# Patient Record
Sex: Female | Born: 1975 | Race: Black or African American | Hispanic: No | Marital: Married | Smoking: Never smoker
Health system: Southern US, Community
[De-identification: ages and names within clinical notes are randomized; demographics above are authoritative.]

---

## 2019-03-09 ENCOUNTER — Emergency Department (HOSPITAL_BASED_OUTPATIENT_CLINIC_OR_DEPARTMENT_OTHER): Payer: Self-pay

## 2019-03-09 ENCOUNTER — Emergency Department (HOSPITAL_BASED_OUTPATIENT_CLINIC_OR_DEPARTMENT_OTHER)
Admission: EM | Admit: 2019-03-09 | Discharge: 2019-03-10 | Disposition: A | Discharge status: Home / Self Care | Payer: Self-pay | Attending: Emergency Medicine | Admitting: Emergency Medicine

## 2019-03-09 ENCOUNTER — Other Ambulatory Visit: Payer: Self-pay

## 2019-03-09 ENCOUNTER — Encounter (HOSPITAL_BASED_OUTPATIENT_CLINIC_OR_DEPARTMENT_OTHER): Payer: Self-pay | Admitting: Emergency Medicine

## 2019-03-09 DIAGNOSIS — R1011 Right upper quadrant pain: Secondary | ICD-10-CM | POA: Insufficient documentation

## 2019-03-09 DIAGNOSIS — D72829 Elevated white blood cell count, unspecified: Secondary | ICD-10-CM | POA: Insufficient documentation

## 2019-03-09 DIAGNOSIS — R109 Unspecified abdominal pain: Secondary | ICD-10-CM

## 2019-03-09 DIAGNOSIS — D649 Anemia, unspecified: Secondary | ICD-10-CM | POA: Insufficient documentation

## 2019-03-09 LAB — COMPREHENSIVE METABOLIC PANEL
ALT: 17 U/L (ref 0–44)
AST: 19 U/L (ref 15–41)
Albumin: 4.1 g/dL (ref 3.5–5.0)
Alkaline Phosphatase: 48 U/L (ref 38–126)
Anion gap: 9 (ref 5–15)
BUN: 9 mg/dL (ref 6–20)
CO2: 24 mmol/L (ref 22–32)
Calcium: 9.6 mg/dL (ref 8.9–10.3)
Chloride: 104 mmol/L (ref 98–111)
Creatinine, Ser: 0.62 mg/dL (ref 0.44–1.00)
GFR calc Af Amer: >60 mL/min (ref >60)
GFR calc non Af Amer: >60 mL/min (ref >60)
Glucose, Bld: 125 mg/dL — ABNORMAL HIGH (ref 70–99)
Potassium: 3.8 mmol/L (ref 3.5–5.1)
Sodium: 137 mmol/L (ref 135–145)
Total Bilirubin: 0.3 mg/dL (ref 0.3–1.2)
Total Protein: 9.1 g/dL — ABNORMAL HIGH (ref 6.5–8.1)

## 2019-03-09 LAB — CBC
HCT: 33.7 % — ABNORMAL LOW (ref 36.0–46.0)
Hemoglobin: 10.2 g/dL — ABNORMAL LOW (ref 12.0–15.0)
MCH: 22.3 pg — ABNORMAL LOW (ref 26.0–34.0)
MCHC: 30.3 g/dL (ref 30.0–36.0)
MCV: 73.6 fL — ABNORMAL LOW (ref 80.0–100.0)
Platelets: 349 10*3/uL (ref 150–400)
RBC: 4.58 MIL/uL (ref 3.87–5.11)
RDW: 16.8 % — ABNORMAL HIGH (ref 11.5–15.5)
WBC: 16.2 10*3/uL — ABNORMAL HIGH (ref 4.0–10.5)
nRBC: 0 % (ref 0.0–0.2)

## 2019-03-09 LAB — LIPASE, BLOOD: Lipase: 28 U/L (ref 11–51)

## 2019-03-09 MED ORDER — MORPHINE SULFATE (PF) 4 MG/ML IV SOLN
4.0000 mg | Freq: Once | INTRAVENOUS | Status: AC
  Administered 2019-03-09: 4 mg via INTRAVENOUS
  Filled 2019-03-09: qty 1

## 2019-03-09 MED ORDER — ONDANSETRON HCL 4 MG/2ML IJ SOLN
4.0000 mg | Freq: Once | INTRAMUSCULAR | Status: AC
  Administered 2019-03-09: 4 mg via INTRAVENOUS
  Filled 2019-03-09: qty 2

## 2019-03-09 MED ORDER — SODIUM CHLORIDE 0.9 % IV BOLUS
1000.0000 mL | Freq: Once | INTRAVENOUS | Status: AC
  Administered 2019-03-10: 1000 mL via INTRAVENOUS

## 2019-03-09 NOTE — ED Notes (Signed)
Patient in [place].

## 2019-03-09 NOTE — ED Triage Notes (Signed)
Pt is c/o right side abd pain that started this morning around 8am  Pt states the pain radiates into her back  Pt states she has used OTC medication without relief  Pt states she had vomiting this morning but it has subsided for now  No diarrhea

## 2019-03-09 NOTE — ED Provider Notes (Signed)
[facility] EMERGENCY DEPARTMENT Provider Note   CSN: [id number] Arrival date & time: 03/09/19  2048  History   Chief Complaint Chief Complaint  Patient presents with  . Abdominal Pain    HPI Paula Hurst is a 43 y.o. female without significant past medical hx who presents to the ED w/ complaints of abdominal pain that began @ 0800 this AM. Patient states she woke up feeling fairly okay, had a BM and shortly after developed fairly quick onset pain to the RUQ of the abdomen radiating around to her back. States pain is an aching/dull sensation, 8/10 in severity, and has been constant without alleviating/aggravating factors. Tried ginger ale, miralax, and tylenol without relief. Today is first day of her menses, typically she has several bowel movements w/ gas on first day of menses, however has not had re-occurrent bowel movement or gas since this AM. Has had nausea w/ 3 episodes of emesis earlier today but this has improved. Denies fever, chills, hematemesis, diarrhea, melena, dysuria, dyspnea, or chest pain.       HPI  History reviewed. No pertinent past medical history.  There are no active problems to display for this patient.   History reviewed. No pertinent surgical history.   OB History   No obstetric history on file.      Home Medications    Prior to Admission medications   Not on File    Family History Family History  Problem Relation Age of Onset  . Diabetes Other   . Hypertension Other     Social History Social History   Tobacco Use  . Smoking status: Never Smoker  . Smokeless tobacco: Never Used  Substance Use Topics  . Alcohol use: Never    Frequency: Never  . Drug use: Never     Allergies   Shellfish allergy   Review of Systems Review of Systems  Constitutional: Negative for chills and fever.  Respiratory: Negative for cough and shortness of breath.   Cardiovascular: Negative for chest pain.  Gastrointestinal: Positive for  abdominal pain, nausea and vomiting. Negative for blood in stool and diarrhea.  Genitourinary: Positive for vaginal bleeding (menses). Negative for dysuria and vaginal discharge.  All other systems reviewed and are negative.    Physical Exam Updated Vital Signs BP (!) 150/90 (BP Location: Left Arm)   Pulse 81   Temp 98.4 F (36.9 C) (Oral)   Resp 18   Ht 5\' 6"  (1.676 m)   Wt 108.4 kg   LMP 03/09/2019 (Exact Date)   SpO2 98%   BMI 38.58 kg/m   Physical Exam Vitals signs and nursing note reviewed.  Constitutional:      General: She is not in acute distress.    Appearance: She is well-developed. She is not toxic-appearing.  HENT:     Head: Normocephalic and atraumatic.  Eyes:     General:        Right eye: No discharge.        Left eye: No discharge.     Conjunctiva/sclera: Conjunctivae normal.  Neck:     Musculoskeletal: Neck supple.  Cardiovascular:     Rate and Rhythm: Normal rate and regular rhythm.  Pulmonary:     Effort: Pulmonary effort is normal. No respiratory distress.     Breath sounds: Normal breath sounds. No wheezing, rhonchi or rales.  Abdominal:     General: There is no distension.     Palpations: Abdomen is soft.     Tenderness: There is  abdominal tenderness in the right upper quadrant. There is no guarding or rebound. Negative signs include McBurney's sign.  Skin:    General: Skin is warm and dry.     Findings: No rash.  Neurological:     Mental Status: She is alert.     Comments: Clear speech.   Psychiatric:        Behavior: Behavior normal.    ED Treatments / Results  Labs (all labs ordered are listed, but only abnormal results are displayed) Labs Reviewed  COMPREHENSIVE METABOLIC PANEL - Abnormal; Notable for the following components:      Result Value   Glucose, Bld 125 (*)    Total Protein 9.1 (*)    All other components within normal limits  CBC - Abnormal; Notable for the following components:   WBC 16.2 (*)    Hemoglobin 10.2 (*)     HCT 33.7 (*)    MCV 73.6 (*)    MCH 22.3 (*)    RDW 16.8 (*)    All other components within normal limits  LIPASE, BLOOD  URINALYSIS, ROUTINE W REFLEX MICROSCOPIC  PREGNANCY, URINE    EKG None  Radiology No results found.  Procedures Procedures (including critical care time)  Medications Ordered in ED Medications  morphine 4 MG/ML injection 4 mg (4 mg Intravenous Given 03/09/19 2243)  ondansetron (ZOFRAN) injection 4 mg (4 mg Intravenous Given 03/09/19 2243)     Initial Impression / Assessment and Plan / ED Course  I have reviewed the triage vital signs and the nursing notes.  Pertinent labs & imaging results that were available during my care of the patient were reviewed by me and considered in my medical decision making (see chart for details).   Patient presents to the ED w/ abdominal pain that began this AM following normal BM, no BM/passing gas since, onset of menses this AM as well. Has had nausea/vomiting that is improved. Nontoxic appearing, no apparent distress, vitals WNL with the exception of mildly elevated BP- doubt HTN emergency. Tenderness to palpation in the RUQ without peritoneal signs. Will evaluate initially w/ labs & RUQ US, analgesics & anti-emetics ordered, plan for re-assessment.   Work-up reviewed: CBC: Leukocytosis @ 16.2. Mild anemia w/ hgb/hct 10.2/33.7- no old on record for comparison- PCP recheck.  CMP: Mild hyperglycemia @ 125, otherwise unremarkable. LFTs & renal function WNL.  UA/preg test: in process.  RUQ US negative- no cholecystitis/cholelithiasis.  On re-evaluation patient states pain initially improved w/ morphine, has now returned, currently 6/10 in severity, remains w/ RUQ tenderness. Discussed w/ supervising physician Dr. Lockie Molauratolo- in agreement w/ plan for CT abdomen/pelvis for further assessment. Additional analgesics ordered at this time.   00:10: Patient care transitioned to Dr. Nicanor AlconPalumbo at change of shift pending CT imaging, urine,  & disposition.   Final Clinical Impressions(s) / ED Diagnoses   Final diagnoses:  Abdominal pain    ED Discharge Orders    None       Cherly Andersonetrucelli,  R, PA-C 03/10/19 0011    Virgina NorfolkCuratolo, Adam, DO 03/11/19 1549

## 2019-03-10 ENCOUNTER — Emergency Department (HOSPITAL_BASED_OUTPATIENT_CLINIC_OR_DEPARTMENT_OTHER): Payer: Self-pay

## 2019-03-10 LAB — URINALYSIS, ROUTINE W REFLEX MICROSCOPIC
Bilirubin Urine: NEGATIVE
Glucose, UA: NEGATIVE mg/dL
Ketones, ur: NEGATIVE mg/dL
Leukocytes,Ua: NEGATIVE
Nitrite: NEGATIVE
Protein, ur: 30 mg/dL — AB
Specific Gravity, Urine: 1.02 (ref 1.005–1.030)
pH: 6 (ref 5.0–8.0)

## 2019-03-10 LAB — URINALYSIS, MICROSCOPIC (REFLEX): WBC, UA: NONE SEEN WBC/hpf (ref 0–5)

## 2019-03-10 LAB — PREGNANCY, URINE: Preg Test, Ur: NEGATIVE

## 2019-03-10 MED ORDER — DICYCLOMINE HCL 20 MG PO TABS: 20.0000 mg | ORAL_TABLET | Freq: Two times a day (BID) | ORAL | 0 refills | Status: AC

## 2019-03-10 MED ORDER — KETOROLAC TROMETHAMINE 30 MG/ML IJ SOLN
15.0000 mg | Freq: Once | INTRAMUSCULAR | Status: AC
  Administered 2019-03-10: 15 mg via INTRAVENOUS
  Filled 2019-03-10: qty 1

## 2019-03-10 MED ORDER — FAMOTIDINE IN NACL 20-0.9 MG/50ML-% IV SOLN
20.0000 mg | Freq: Once | INTRAVENOUS | Status: AC
  Administered 2019-03-10: 20 mg via INTRAVENOUS
  Filled 2019-03-10: qty 50

## 2019-03-10 MED ORDER — IOHEXOL 300 MG/ML  SOLN
100.0000 mL | Freq: Once | INTRAMUSCULAR | Status: AC
  Administered 2019-03-10: 100 mL via INTRAVENOUS

## 2019-03-10 MED ORDER — MORPHINE SULFATE (PF) 4 MG/ML IV SOLN
4.0000 mg | Freq: Once | INTRAVENOUS | Status: AC
  Administered 2019-03-10: 4 mg via INTRAVENOUS
  Filled 2019-03-10: qty 1

## 2019-03-10 MED ORDER — ONDANSETRON HCL 4 MG/2ML IJ SOLN
4.0000 mg | Freq: Once | INTRAMUSCULAR | Status: AC
  Administered 2019-03-10: 4 mg via INTRAVENOUS
  Filled 2019-03-10: qty 2

## 2019-03-10 NOTE — ED Notes (Signed)
Patient able to hold down liquids.

## 2021-03-30 IMAGING — US ULTRASOUND ABDOMEN LIMITED
1 series · 14 of 25 positions shown · non-contrast
Comparison: None.

CLINICAL DATA: Right upper quadrant pain with nausea and vomiting

EXAM:
ULTRASOUND ABDOMEN LIMITED RIGHT UPPER QUADRANT

[Series 1: ultrasound abdomen limited · 14 of 36 slices shown]
[im 1/36]
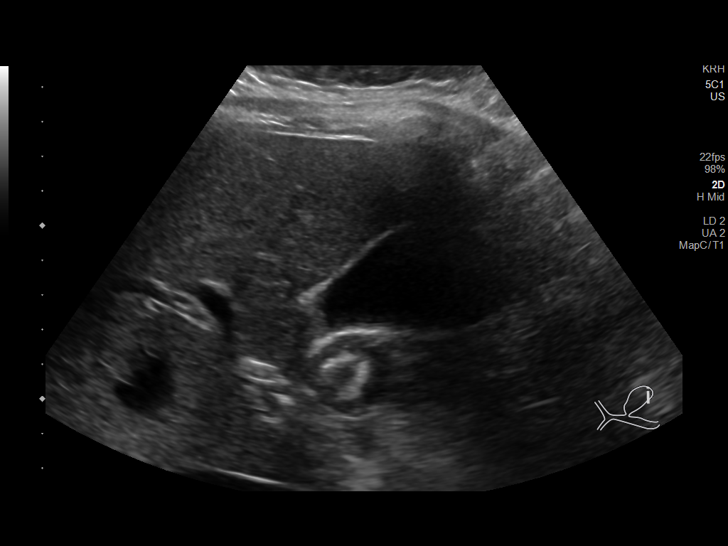
[im 3/36]
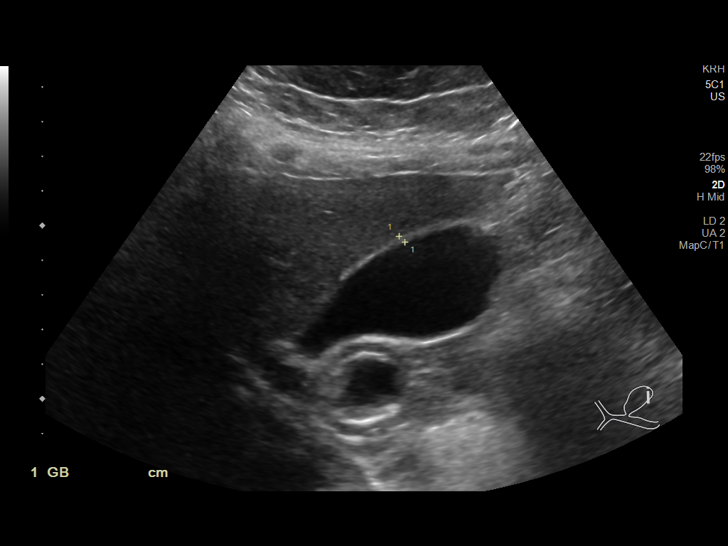
[im 6/36]
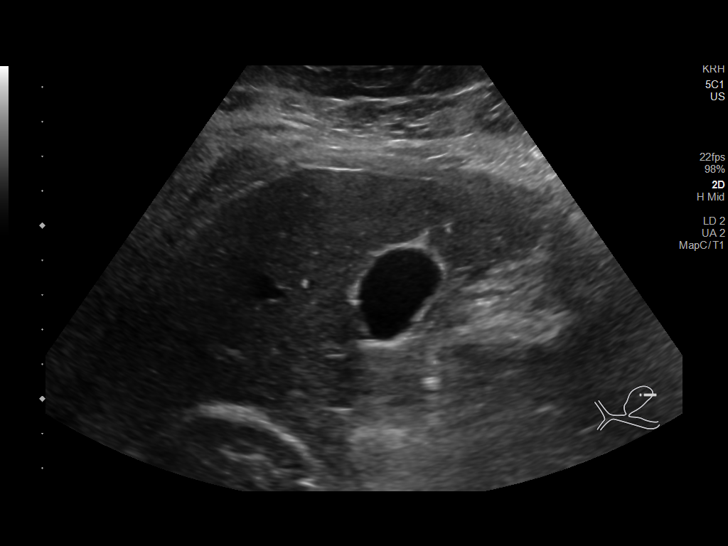
[im 9/36]
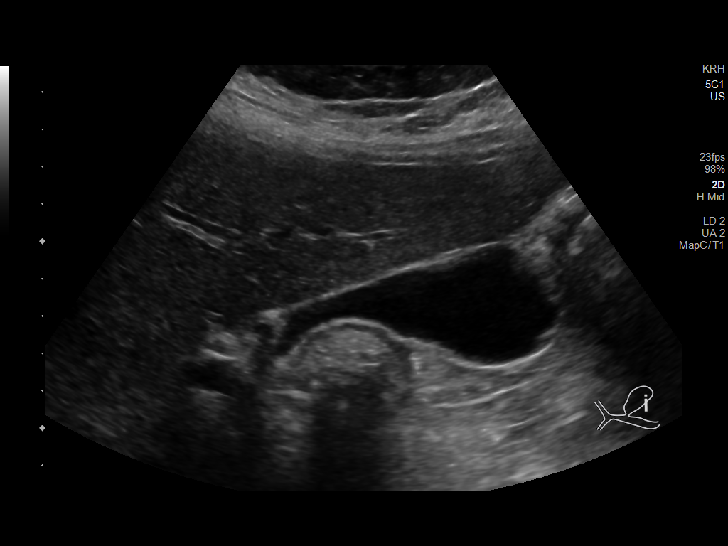
[im 12/36]
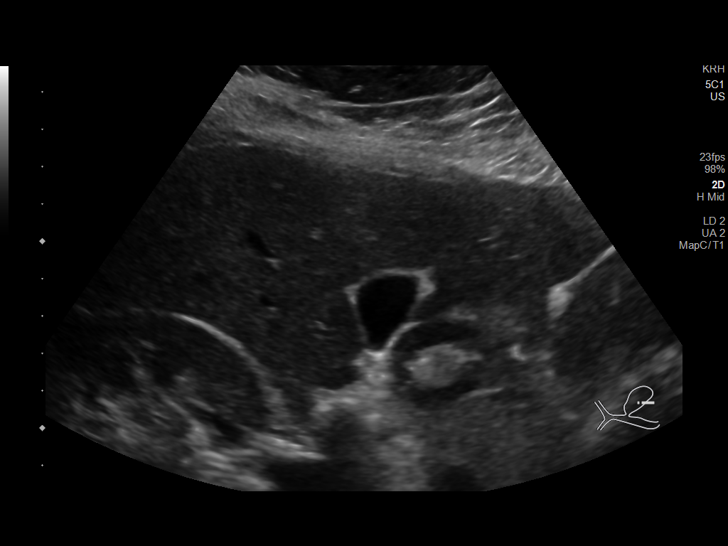
[im 14/36]
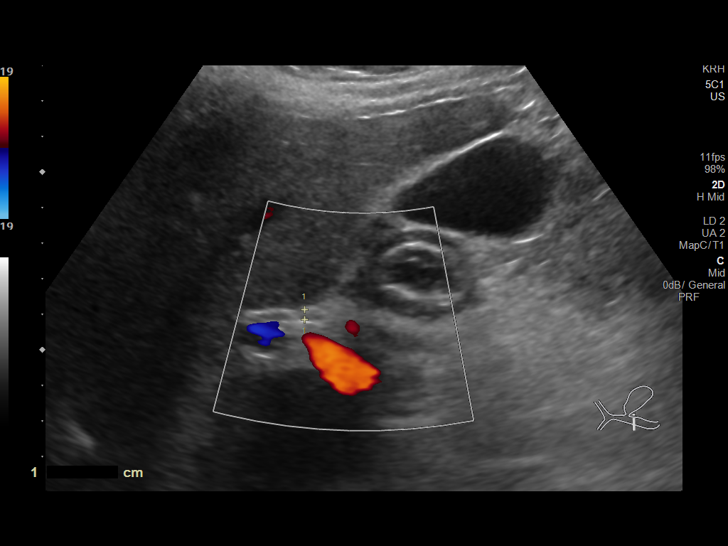
[im 17/36]
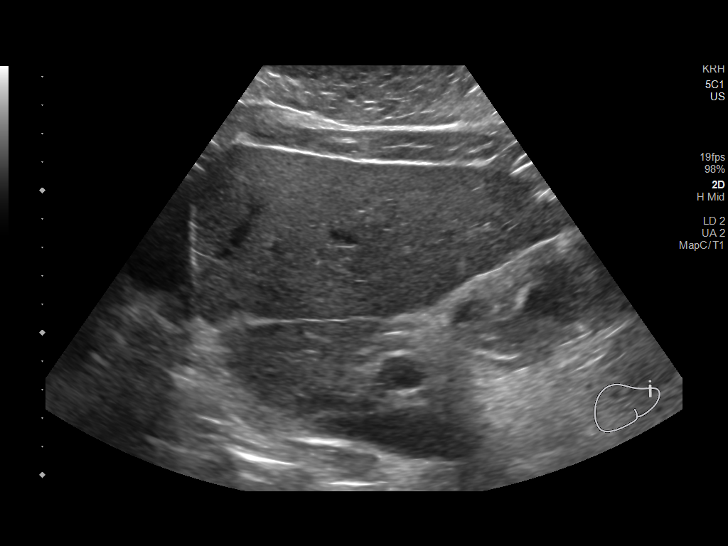
[im 19/36]
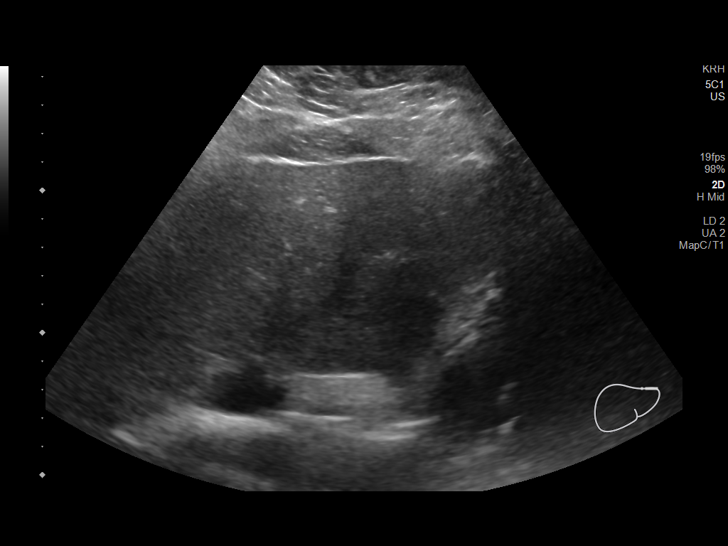
[im 22/36]
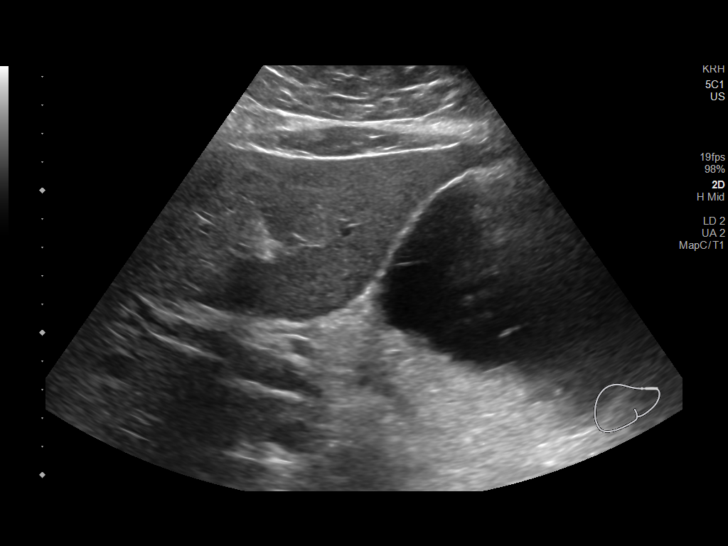
[im 24/36]
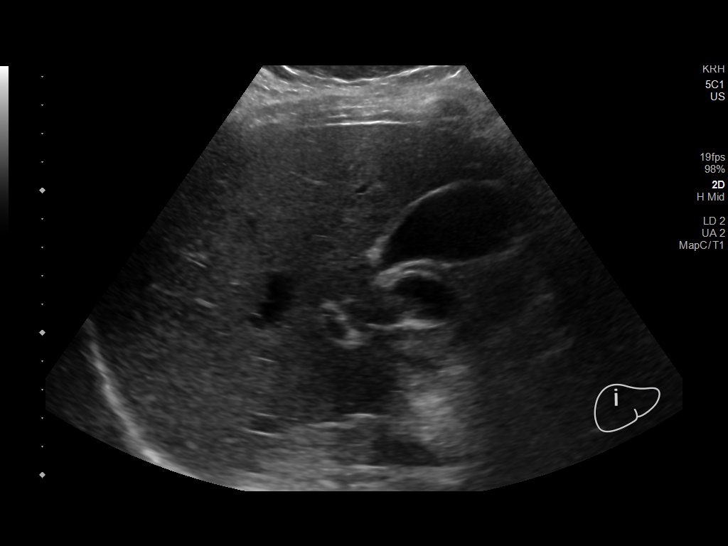
[im 27/36]
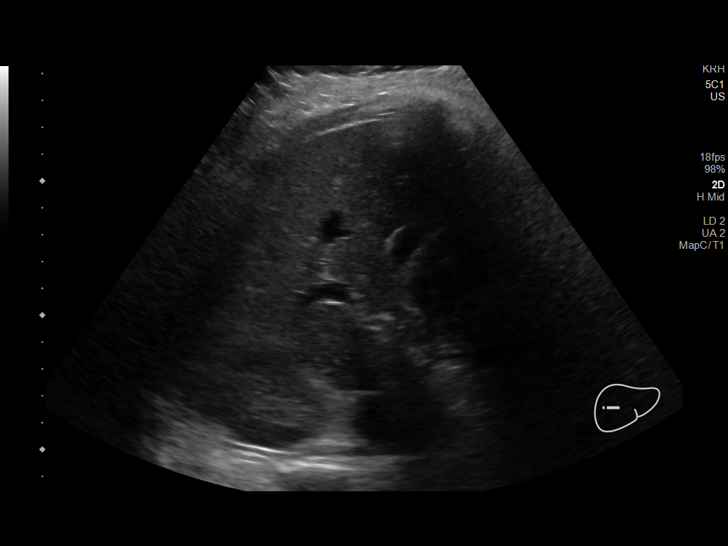
[im 30/36]
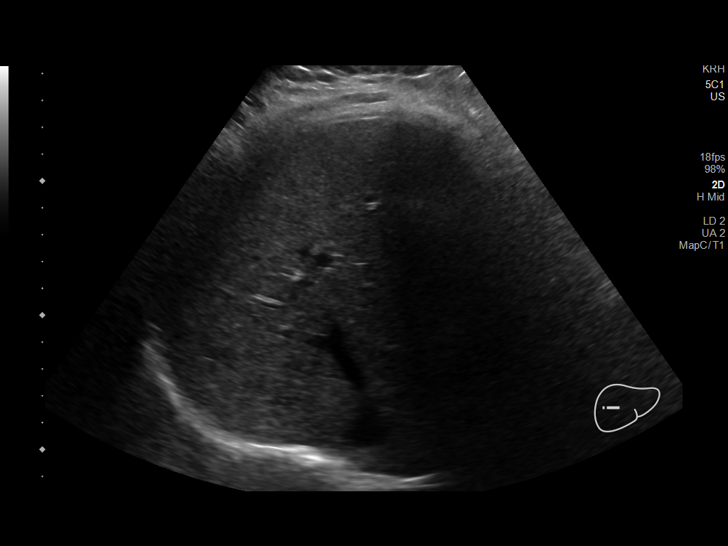
[im 33/36]
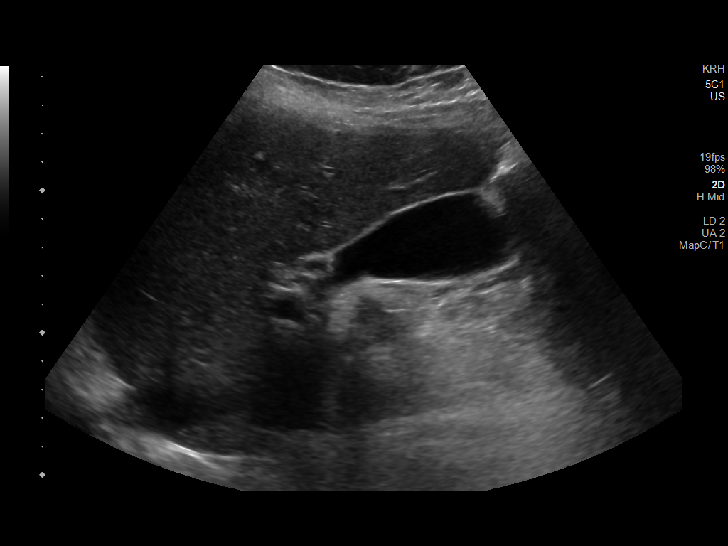
[im 36/36]
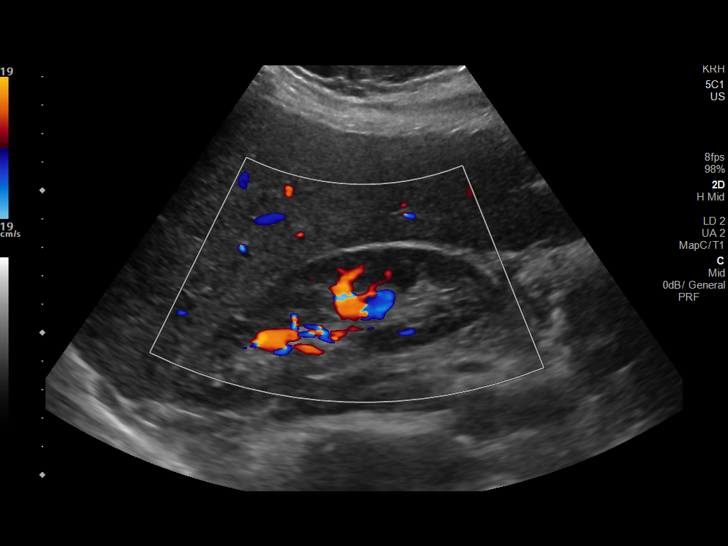

[14 of 25 positions shown; findings below may reference images not displayed]

FINDINGS: Gallbladder:

No gallstones or wall thickening visualized. No sonographic Murphy
sign noted by sonographer.

Common bile duct:

Diameter: 3 mm

Liver:

No focal lesion identified. Within normal limits in parenchymal
echogenicity. Portal vein is patent on color Doppler imaging with
normal direction of blood flow towards the liver.
IMPRESSION: Negative right upper quadrant abdominal ultrasound

## 2021-03-31 IMAGING — CT CT ABDOMEN AND PELVIS WITH CONTRAST
2 of 5 series · 16 of 46 positions shown, 18 images · IV contrast (APPLIED)
Comparison: Ultrasound 03/09/2019

CLINICAL DATA: Right-sided abdominal pain

EXAM:
CT ABDOMEN AND PELVIS WITH CONTRAST
TECHNIQUE: Multidetector CT imaging of the abdomen and pelvis was performed
using the standard protocol following bolus administration of
intravenous contrast.
CONTRAST:  100mL OMNIPAQUE IOHEXOL 300 MG/ML  SOLN

[Series 2: axial st · axial · 0.70mm/px · z∈[-420,-20]mm · 13 of 90 slices shown, 15 images]
[im 5/90  soft-tissue]
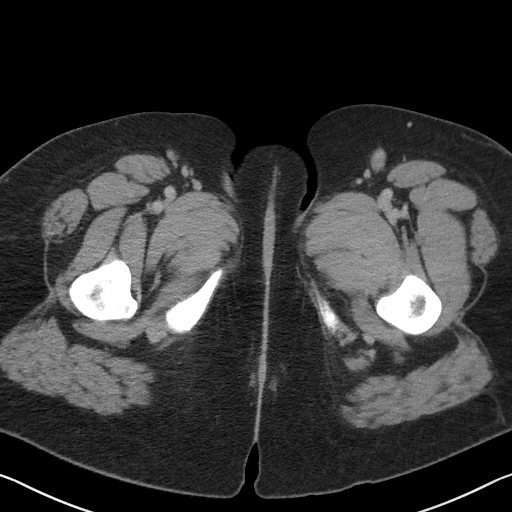
[im 5/90  bone]
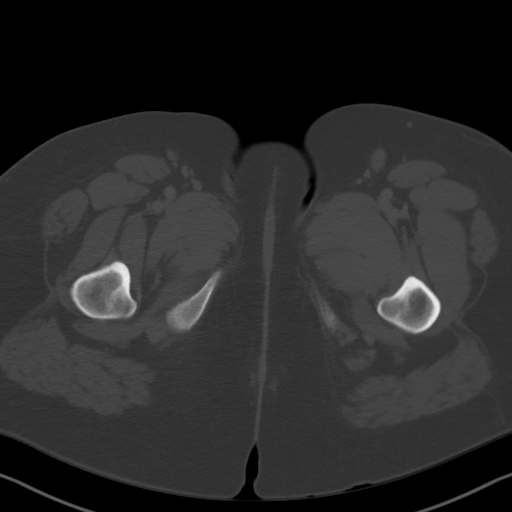
[im 15/90  soft-tissue]
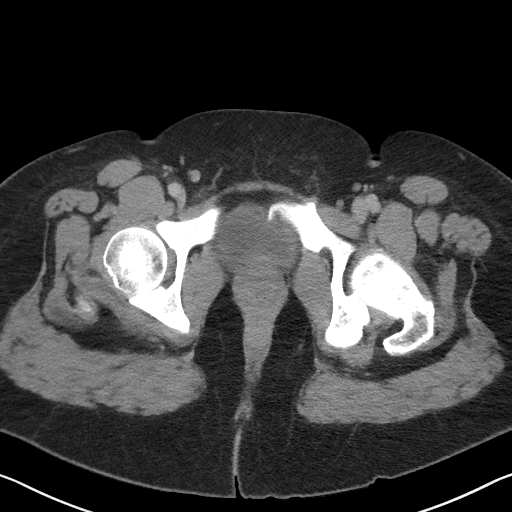
[im 19/90  soft-tissue]
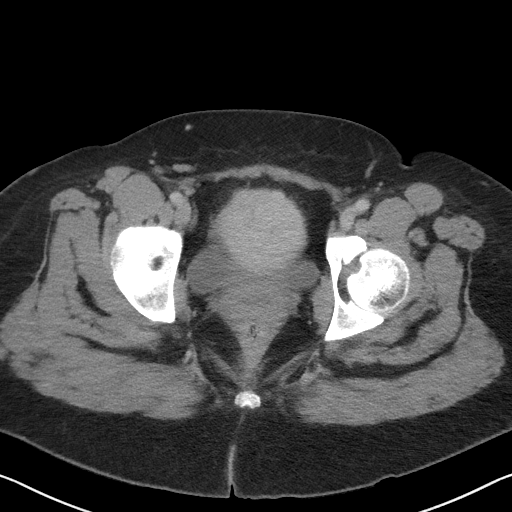
[im 24/90  soft-tissue]
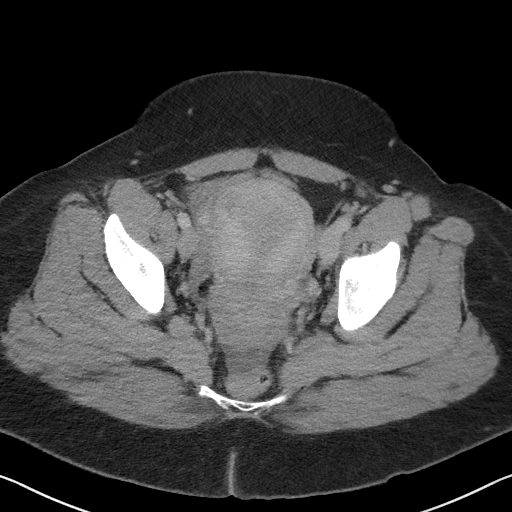
[im 33/90  soft-tissue]
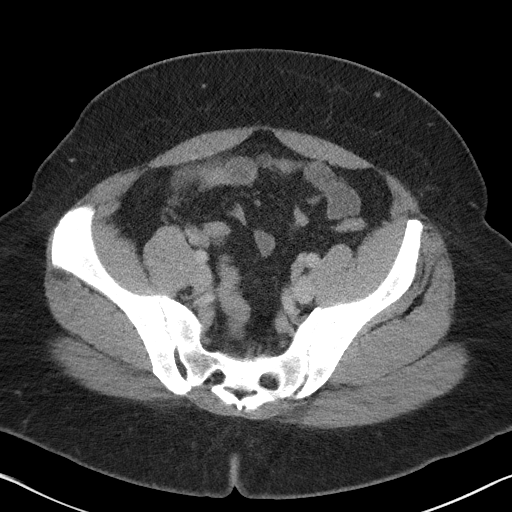
[im 38/90  soft-tissue]
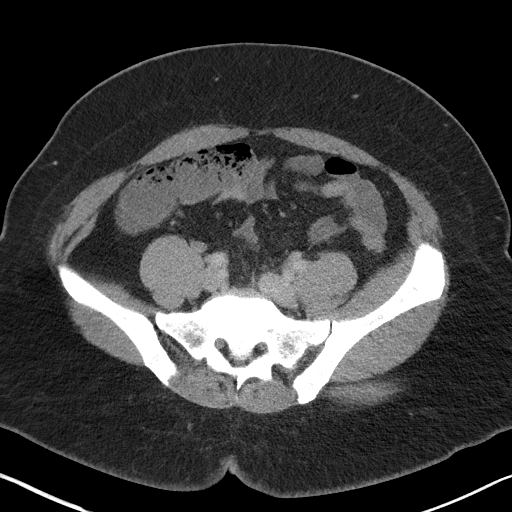
[im 47/90  soft-tissue]
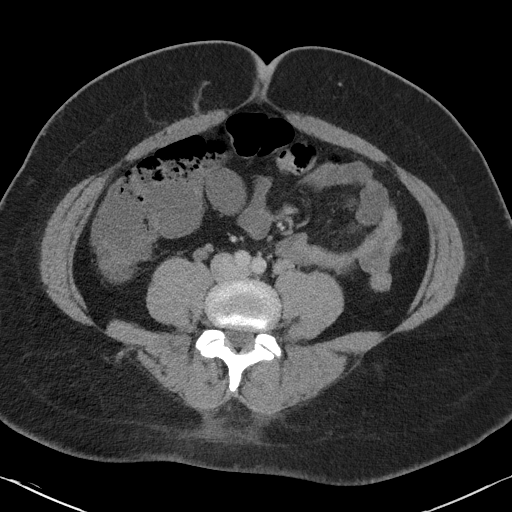
[im 52/90  soft-tissue]
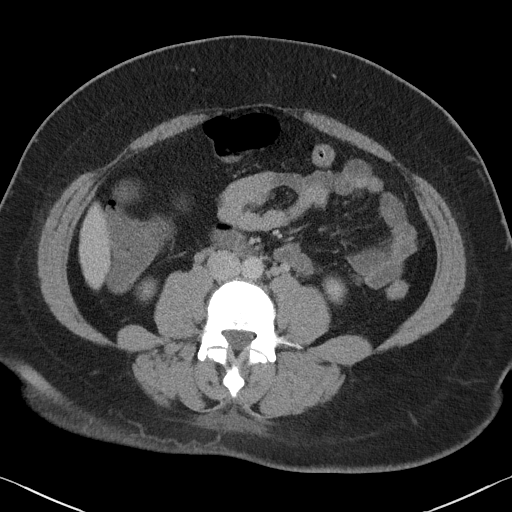
[im 57/90  soft-tissue]
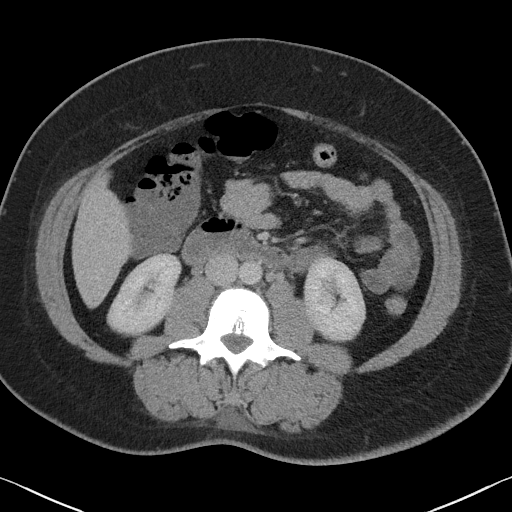
[im 57/90  bone]
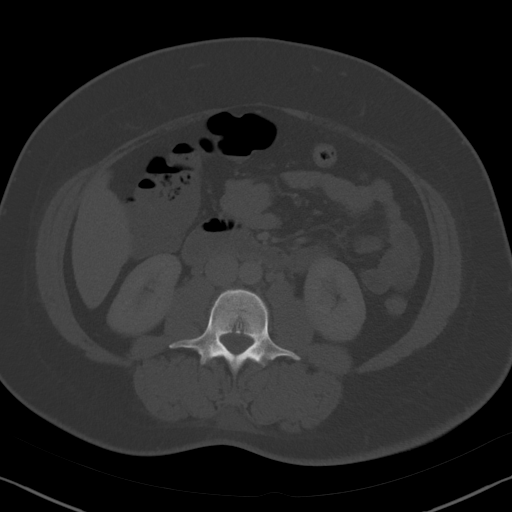
[im 66/90  soft-tissue]
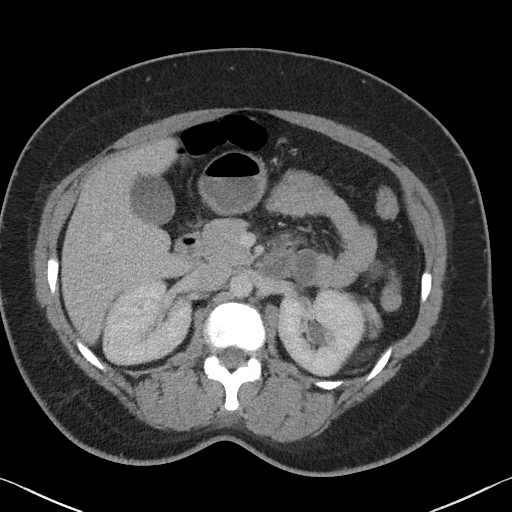
[im 71/90  soft-tissue]
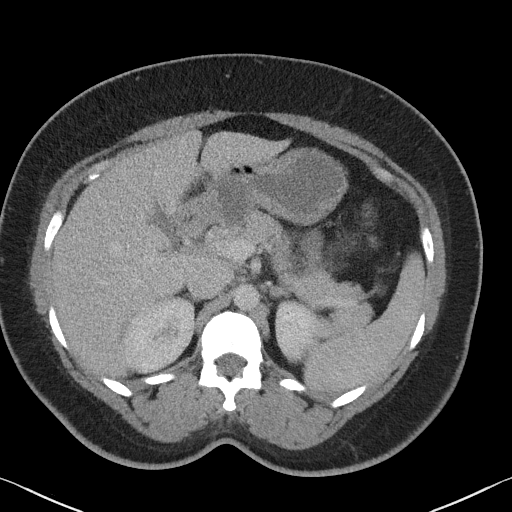
[im 75/90  soft-tissue]
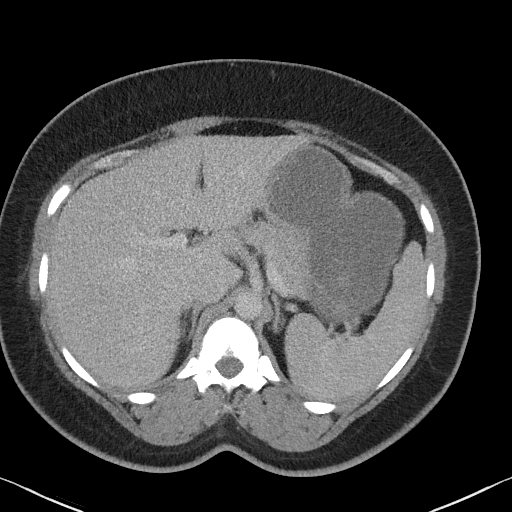
[im 85/90  soft-tissue]
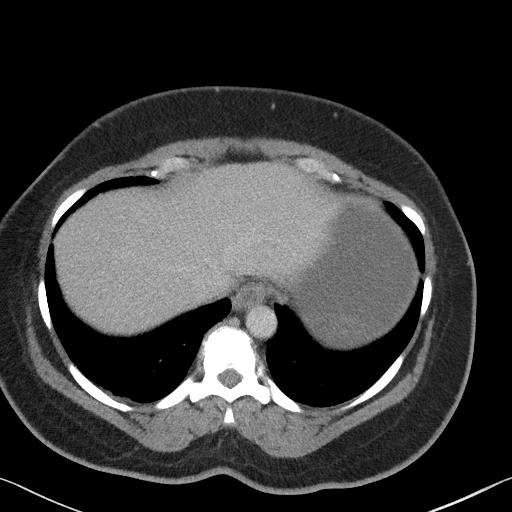

[Series 5: coronal st · coronal · 0.70mm/px · 3 of 109 slices shown]
[im 37/109  soft-tissue]
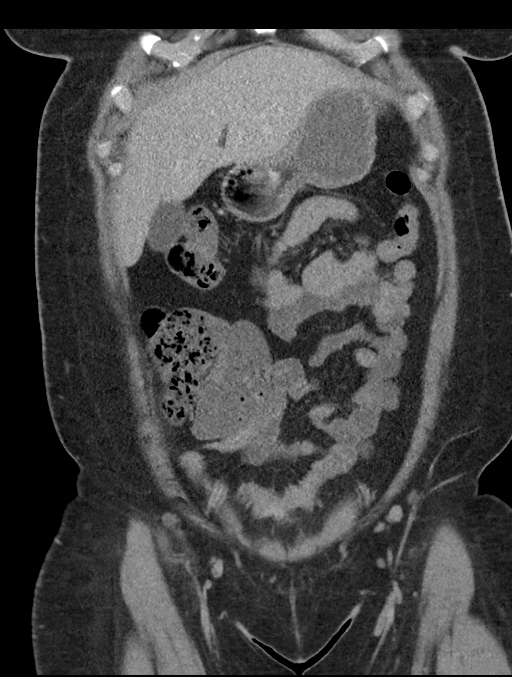
[im 49/109  soft-tissue]
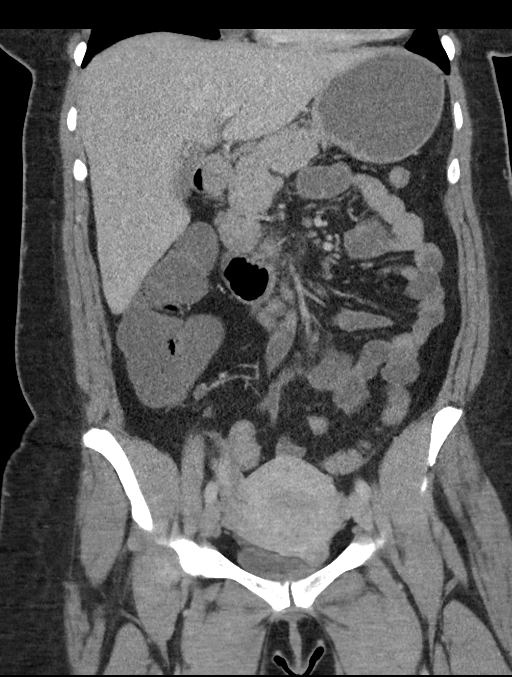
[im 61/109  soft-tissue]
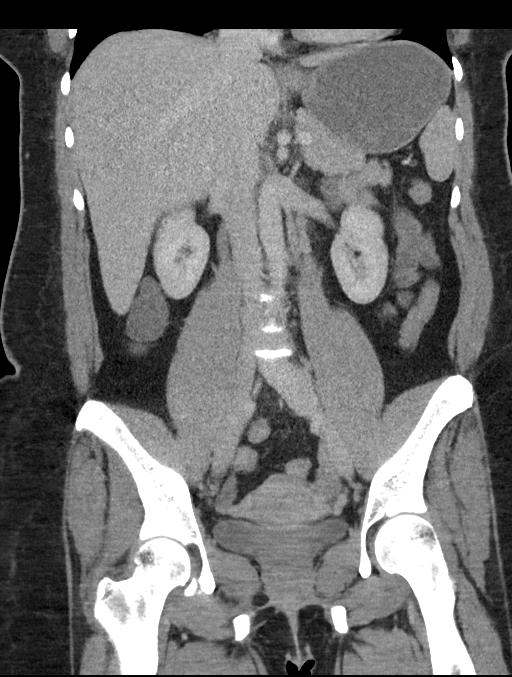

[16 of 46 positions shown; findings below may reference images not displayed]

FINDINGS: Lower chest: Lung bases demonstrate no acute consolidation or
effusion. Borderline cardiomegaly

Hepatobiliary: No focal liver abnormality is seen. No gallstones,
gallbladder wall thickening, or biliary dilatation.

Pancreas: Unremarkable. No pancreatic ductal dilatation or
surrounding inflammatory changes.

Spleen: Normal in size without focal abnormality.

Adrenals/Urinary Tract: Adrenal glands are unremarkable. Kidneys are
normal, without renal calculi, focal lesion, or hydronephrosis.
Bladder is unremarkable.

Stomach/Bowel: Stomach is within normal limits. Appendix appears
normal. No evidence of bowel wall thickening, distention, or
inflammatory changes.

Vascular/Lymphatic: No significant vascular findings are present. No
enlarged abdominal or pelvic lymph nodes.

Reproductive: No adnexal mass. 2.8 cm exophytic fibroid off the
anterior uterus.

Other: Negative for free fluid or free air.

Musculoskeletal: No acute or significant osseous findings.
IMPRESSION: 1. No CT evidence for acute intra-abdominal or pelvic abnormality.
2. Fibroid uterus
